# Patient Record
Sex: Male | Born: 1995
Health system: Southern US, Community
[De-identification: ages and names within clinical notes are randomized; demographics above are authoritative.]

## PROBLEM LIST (undated history)

## (undated) DIAGNOSIS — B081 Molluscum contagiosum: Secondary | ICD-10-CM

## (undated) HISTORY — PX: MOUTH SURGERY: SHX715

## (undated) HISTORY — PX: WISDOM TOOTH EXTRACTION: SHX21

## (undated) HISTORY — DX: Molluscum contagiosum: B08.1

---
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
## 2015-07-25 DIAGNOSIS — L700 Acne vulgaris: Secondary | ICD-10-CM | POA: Diagnosis not present

## 2015-07-25 DIAGNOSIS — Z79899 Other long term (current) drug therapy: Secondary | ICD-10-CM | POA: Diagnosis not present

## 2015-07-26 DIAGNOSIS — Z79899 Other long term (current) drug therapy: Secondary | ICD-10-CM | POA: Diagnosis not present

## 2015-07-26 DIAGNOSIS — L700 Acne vulgaris: Secondary | ICD-10-CM | POA: Diagnosis not present

## 2015-08-25 DIAGNOSIS — L700 Acne vulgaris: Secondary | ICD-10-CM | POA: Diagnosis not present

## 2015-08-25 DIAGNOSIS — Z79899 Other long term (current) drug therapy: Secondary | ICD-10-CM | POA: Diagnosis not present

## 2015-08-25 DIAGNOSIS — K130 Diseases of lips: Secondary | ICD-10-CM | POA: Diagnosis not present

## 2015-08-26 DIAGNOSIS — L700 Acne vulgaris: Secondary | ICD-10-CM | POA: Diagnosis not present

## 2015-08-26 DIAGNOSIS — Z79899 Other long term (current) drug therapy: Secondary | ICD-10-CM | POA: Diagnosis not present

## 2015-09-20 DIAGNOSIS — Z79899 Other long term (current) drug therapy: Secondary | ICD-10-CM | POA: Diagnosis not present

## 2015-09-20 DIAGNOSIS — L700 Acne vulgaris: Secondary | ICD-10-CM | POA: Diagnosis not present

## 2015-09-27 DIAGNOSIS — Z79899 Other long term (current) drug therapy: Secondary | ICD-10-CM | POA: Diagnosis not present

## 2015-09-27 DIAGNOSIS — L700 Acne vulgaris: Secondary | ICD-10-CM | POA: Diagnosis not present

## 2015-10-10 DIAGNOSIS — R21 Rash and other nonspecific skin eruption: Secondary | ICD-10-CM | POA: Diagnosis not present

## 2015-10-10 DIAGNOSIS — J069 Acute upper respiratory infection, unspecified: Secondary | ICD-10-CM | POA: Diagnosis not present

## 2015-10-12 DIAGNOSIS — R21 Rash and other nonspecific skin eruption: Secondary | ICD-10-CM | POA: Diagnosis not present

## 2015-10-12 DIAGNOSIS — B081 Molluscum contagiosum: Secondary | ICD-10-CM | POA: Diagnosis not present

## 2015-10-12 DIAGNOSIS — L700 Acne vulgaris: Secondary | ICD-10-CM | POA: Diagnosis not present

## 2015-11-08 DIAGNOSIS — J019 Acute sinusitis, unspecified: Secondary | ICD-10-CM | POA: Diagnosis not present

## 2015-11-08 DIAGNOSIS — Z113 Encounter for screening for infections with a predominantly sexual mode of transmission: Secondary | ICD-10-CM | POA: Diagnosis not present

## 2015-11-08 DIAGNOSIS — B081 Molluscum contagiosum: Secondary | ICD-10-CM | POA: Diagnosis not present

## 2015-11-21 DIAGNOSIS — L700 Acne vulgaris: Secondary | ICD-10-CM | POA: Diagnosis not present

## 2015-11-21 DIAGNOSIS — R21 Rash and other nonspecific skin eruption: Secondary | ICD-10-CM | POA: Diagnosis not present

## 2015-11-21 DIAGNOSIS — B081 Molluscum contagiosum: Secondary | ICD-10-CM | POA: Diagnosis not present

## 2015-11-21 DIAGNOSIS — L905 Scar conditions and fibrosis of skin: Secondary | ICD-10-CM | POA: Diagnosis not present

## 2015-12-06 ENCOUNTER — Encounter: Payer: Self-pay | Admitting: Internal Medicine

## 2015-12-06 ENCOUNTER — Ambulatory Visit (INDEPENDENT_AMBULATORY_CARE_PROVIDER_SITE_OTHER): Payer: Self-pay | Admitting: Internal Medicine

## 2015-12-06 DIAGNOSIS — B081 Molluscum contagiosum: Secondary | ICD-10-CM

## 2015-12-06 DIAGNOSIS — Z0000 Encounter for general adult medical examination without abnormal findings: Secondary | ICD-10-CM

## 2015-12-06 DIAGNOSIS — L709 Acne, unspecified: Secondary | ICD-10-CM

## 2015-12-06 NOTE — Progress Notes (Signed)
Pre visit review using our clinic review tool, if applicable. No additional management support is needed unless otherwise documented below in the visit note. 

## 2015-12-06 NOTE — Progress Notes (Signed)
Patient ID: Melvin Murphy, male   DOB: Jul 30, 1995, 20 y.o.   MRN: [id number]   Subjective:    Patient ID: Melvin Murphy, male    DOB: 09-25-1995, 20 y.o.   MRN: [id number]  HPI  Patient here to establish care.  He is a Consulting civil engineer at [employer].  Doing well.  Exercises regularly.  No chest pain.  No history of asthma or breathing issues.  No acid reflux.  No abdominal pain or cramping.  Bowels stable.  Was on accutane.  Stopped 10/01/15.  Developed a rash.  Diagnosed with molluscum contagiosum.  Has seen dermatology.  Treating.  Is better.  Some stress issues related to this.  Discussed with him today.  He does feel he is handling this relatively well.  Not sexually active currently.  Has been in the past.     Past Medical History:  Diagnosis Date  . Molluscum contagiosum    Past Surgical History:  Procedure Laterality Date  . MOUTH SURGERY    . WISDOM TOOTH EXTRACTION     Family History  Problem Relation Age of Onset  . Cancer Father     skin   Social History   Social History  . Marital status: Single    Spouse name: N/A  . Number of children: N/A  . Years of education: N/A   Social History Main Topics  . Smoking status: Never Smoker  . Smokeless tobacco: Never Used  . Alcohol use None  . Drug use: Unknown  . Sexual activity: Not Asked   Other Topics Concern  . None   Social History Narrative  . None    Outpatient Encounter Prescriptions as of 12/06/2015  Medication Sig  . [DISCONTINUED] doxycycline (VIBRA-TABS) 100 MG tablet    No facility-administered encounter medications on file as of 12/06/2015.     Review of Systems  Constitutional: Negative for appetite change and unexpected weight change.  HENT: Negative for congestion and sinus pressure.   Respiratory: Negative for cough, chest tightness and shortness of breath.   Cardiovascular: Negative for chest pain, palpitations and leg swelling.  Gastrointestinal: Negative for abdominal pain, diarrhea, nausea and vomiting.    Genitourinary: Negative for difficulty urinating and dysuria.  Musculoskeletal: Negative for back pain and joint swelling.  Skin: Positive for rash. Negative for color change.  Neurological: Negative for dizziness, light-headedness and headaches.  Psychiatric/Behavioral: Negative for agitation and dysphoric mood.       Some increased stress related to the diagnosis of molluscum.         Objective:    Physical Exam  Constitutional: He appears well-developed and well-nourished. No distress.  HENT:  Nose: Nose normal.  Mouth/Throat: Oropharynx is clear and moist.  Neck: Neck supple. No thyromegaly present.  Cardiovascular: Normal rate and regular rhythm.   Pulmonary/Chest: Effort normal and breath sounds normal. No respiratory distress.  Abdominal: Soft. Bowel sounds are normal. There is no tenderness.  Musculoskeletal: He exhibits no edema or tenderness.  Lymphadenopathy:    He has no cervical adenopathy.  Skin:  Rash noted - suprapubic area and groin.    Psychiatric: He has a normal mood and affect. His behavior is normal.    BP 120/80   Pulse 84   Temp 98.1 F (36.7 C) (Oral)   Ht 6\' 1"  (1.854 m)   Wt 173 lb 6.4 oz (78.7 kg)   SpO2 97%   BMI 22.88 kg/m  Wt Readings from Last 3 Encounters:  12/06/15 173 lb 6.4 oz (  78.7 kg)  01/18/14 175 lb (79.4 kg) (82 %, Z= 0.92)*   * Growth percentiles are based on CDC 2-20 Years data.        Assessment & Plan:   Problem List Items Addressed This Visit    Acne    Has seen dermatology.  Previously treated with accutane. On no medication now.       Healthcare maintenance    States is up to date with immunizations.  Obtain records.  Schedule him for a physical.        Molluscum contagiosum    Has seen dermatology.  Treating.  Improved.  Follow.         Other Visit Diagnoses   None.      Dale DurhamSCOTT, Amias Hutchinson, MD

## 2015-12-19 ENCOUNTER — Encounter: Payer: Self-pay | Admitting: Internal Medicine

## 2015-12-19 DIAGNOSIS — Z0000 Encounter for general adult medical examination without abnormal findings: Secondary | ICD-10-CM | POA: Insufficient documentation

## 2015-12-19 DIAGNOSIS — L709 Acne, unspecified: Secondary | ICD-10-CM | POA: Insufficient documentation

## 2015-12-19 DIAGNOSIS — B081 Molluscum contagiosum: Secondary | ICD-10-CM | POA: Insufficient documentation

## 2015-12-19 NOTE — Assessment & Plan Note (Signed)
States is up to date with immunizations.  Obtain records.  Schedule him for a physical.

## 2015-12-19 NOTE — Assessment & Plan Note (Signed)
Has seen dermatology.  Previously treated with accutane. On no medication now.

## 2015-12-19 NOTE — Assessment & Plan Note (Signed)
Has seen dermatology.  Treating.  Improved.  Follow.

## 2015-12-26 DIAGNOSIS — L700 Acne vulgaris: Secondary | ICD-10-CM | POA: Diagnosis not present

## 2015-12-26 DIAGNOSIS — B081 Molluscum contagiosum: Secondary | ICD-10-CM | POA: Diagnosis not present

## 2015-12-26 DIAGNOSIS — R208 Other disturbances of skin sensation: Secondary | ICD-10-CM | POA: Diagnosis not present

## 2016-01-12 DIAGNOSIS — Z202 Contact with and (suspected) exposure to infections with a predominantly sexual mode of transmission: Secondary | ICD-10-CM | POA: Diagnosis not present

## 2016-02-22 DIAGNOSIS — N489 Disorder of penis, unspecified: Secondary | ICD-10-CM | POA: Diagnosis not present

## 2016-03-06 DIAGNOSIS — I788 Other diseases of capillaries: Secondary | ICD-10-CM | POA: Diagnosis not present

## 2016-03-06 DIAGNOSIS — L738 Other specified follicular disorders: Secondary | ICD-10-CM | POA: Diagnosis not present

## 2016-03-06 DIAGNOSIS — L819 Disorder of pigmentation, unspecified: Secondary | ICD-10-CM | POA: Diagnosis not present

## 2016-03-06 DIAGNOSIS — L720 Epidermal cyst: Secondary | ICD-10-CM | POA: Diagnosis not present

## 2016-03-08 DIAGNOSIS — N481 Balanitis: Secondary | ICD-10-CM | POA: Diagnosis not present

## 2016-03-08 DIAGNOSIS — N4889 Other specified disorders of penis: Secondary | ICD-10-CM | POA: Diagnosis not present

## 2016-03-26 DIAGNOSIS — L738 Other specified follicular disorders: Secondary | ICD-10-CM | POA: Diagnosis not present

## 2016-03-29 DIAGNOSIS — J029 Acute pharyngitis, unspecified: Secondary | ICD-10-CM | POA: Diagnosis not present

## 2016-04-02 DIAGNOSIS — L720 Epidermal cyst: Secondary | ICD-10-CM | POA: Diagnosis not present

## 2016-04-02 DIAGNOSIS — L738 Other specified follicular disorders: Secondary | ICD-10-CM | POA: Diagnosis not present

## 2016-04-23 DIAGNOSIS — L738 Other specified follicular disorders: Secondary | ICD-10-CM | POA: Diagnosis not present

## 2016-04-23 DIAGNOSIS — L309 Dermatitis, unspecified: Secondary | ICD-10-CM | POA: Diagnosis not present

## 2016-04-23 DIAGNOSIS — L720 Epidermal cyst: Secondary | ICD-10-CM | POA: Diagnosis not present

## 2016-05-16 DIAGNOSIS — L739 Follicular disorder, unspecified: Secondary | ICD-10-CM | POA: Diagnosis not present

## 2016-05-16 DIAGNOSIS — Z711 Person with feared health complaint in whom no diagnosis is made: Secondary | ICD-10-CM | POA: Diagnosis not present

## 2016-06-06 DIAGNOSIS — L720 Epidermal cyst: Secondary | ICD-10-CM | POA: Diagnosis not present

## 2016-06-06 DIAGNOSIS — L0390 Cellulitis, unspecified: Secondary | ICD-10-CM | POA: Diagnosis not present

## 2016-06-07 ENCOUNTER — Encounter: Payer: Self-pay | Admitting: Internal Medicine

## 2016-06-07 ENCOUNTER — Ambulatory Visit (INDEPENDENT_AMBULATORY_CARE_PROVIDER_SITE_OTHER): Payer: BLUE CROSS/BLUE SHIELD | Admitting: Internal Medicine

## 2016-06-07 VITALS — BP 118/78 | HR 71 | Temp 97.6°F | Resp 12 | Ht 73.23 in | Wt 175.8 lb

## 2016-06-07 DIAGNOSIS — G479 Sleep disorder, unspecified: Secondary | ICD-10-CM

## 2016-06-07 DIAGNOSIS — N489 Disorder of penis, unspecified: Secondary | ICD-10-CM | POA: Diagnosis not present

## 2016-06-07 DIAGNOSIS — B081 Molluscum contagiosum: Secondary | ICD-10-CM | POA: Diagnosis not present

## 2016-06-07 DIAGNOSIS — Z0000 Encounter for general adult medical examination without abnormal findings: Secondary | ICD-10-CM

## 2016-06-07 NOTE — Progress Notes (Signed)
Pre-visit discussion using our clinic review tool. No additional management support is needed unless otherwise documented below in the visit note.  

## 2016-06-07 NOTE — Progress Notes (Signed)
Patient ID: Melvin Murphy, male   DOB: [birth year], 21 y.o.   MRN: [id number]   Subjective:    Patient ID: MACOY RODWELL, male    DOB: 08/20/1995, 21 y.o.   MRN: [id number]  HPI  Patient here for his physical exam.  He states he is doing relatively well.  Increased work hours and increased classes.  Also has commitments with organizations at school.  Due to time, decreased hours of sleep.  Discussed sleeping more on the weekends.  Is exercising.  School will be out in one month.  No chest pain.  Breathing stable.  No significant problems with allergies/sinus.  No acid reflux.  No abdominal pain.  Bowels moving.  Lesion on penis.  Just evaluated by dermatology last week.  Felt to be cyst.  Molluscum rash cleared.  Was checked for STDs at acute care.  HSV 1 present.  Discussed with him today.  Overall he feels he is doing well.     Past Medical History:  Diagnosis Date  . Molluscum contagiosum    Past Surgical History:  Procedure Laterality Date  . MOUTH SURGERY    . WISDOM TOOTH EXTRACTION     Family History  Problem Relation Age of Onset  . Cancer Father     skin   Social History   Social History  . Marital status: Single    Spouse name: N/A  . Number of children: N/A  . Years of education: N/A   Social History Main Topics  . Smoking status: Never Smoker  . Smokeless tobacco: Never Used  . Alcohol use None  . Drug use: Unknown  . Sexual activity: Not Asked   Other Topics Concern  . None   Social History Narrative  . None    Outpatient Encounter Prescriptions as of 06/07/2016  Medication Sig  . doxycycline (DORYX) 100 MG EC tablet Take 100 mg by mouth 2 (two) times daily.   No facility-administered encounter medications on file as of 06/07/2016.     Review of Systems  Constitutional: Negative for appetite change and unexpected weight change.  HENT: Negative for congestion and sinus pressure.   Eyes: Negative for pain and visual disturbance.  Respiratory: Negative for  cough, chest tightness and shortness of breath.   Cardiovascular: Negative for chest pain, palpitations and leg swelling.  Gastrointestinal: Negative for abdominal pain, diarrhea, nausea and vomiting.  Genitourinary: Negative for difficulty urinating and dysuria.  Musculoskeletal: Negative for back pain and joint swelling.  Skin: Negative for color change and rash.  Neurological: Negative for dizziness, light-headedness and headaches.  Hematological: Negative for adenopathy. Does not bruise/bleed easily.  Psychiatric/Behavioral: Negative for agitation and dysphoric mood.       Objective:    Physical Exam  Constitutional: He is oriented to person, place, and time. He appears well-developed and well-nourished. No distress.  HENT:  Head: Normocephalic and atraumatic.  Nose: Nose normal.  Mouth/Throat: Oropharynx is clear and moist. No oropharyngeal exudate.  Eyes: Conjunctivae are normal. Right eye exhibits no discharge. Left eye exhibits no discharge.  Neck: Neck supple. No thyromegaly present.  Cardiovascular: Normal rate and regular rhythm.   Pulmonary/Chest: Breath sounds normal. No respiratory distress. He has no wheezes.  Abdominal: Soft. Bowel sounds are normal. There is no tenderness.  Genitourinary:  Genitourinary Comments: Penis - lesion - base of penis.  No herpetic lesions noted.  No palpable testicular nodules.    Musculoskeletal: He exhibits no edema or tenderness.  Lymphadenopathy:  He has no cervical adenopathy.  Neurological: He is alert and oriented to person, place, and time.  Skin: Skin is warm and dry. No rash noted. No erythema.  Psychiatric: He has a normal mood and affect. His behavior is normal.    BP 118/78 (BP Location: Left Arm, Patient Position: Sitting, Cuff Size: Normal)   Pulse 71   Temp 97.6 F (36.4 C) (Oral)   Resp 12   Ht 6' 1.23" (1.86 m)   Wt 175 lb 12.8 oz (79.7 kg)   SpO2 98%   BMI 23.05 kg/m  Wt Readings from Last 3 Encounters:    06/07/16 175 lb 12.8 oz (79.7 kg)  12/06/15 173 lb 6.4 oz (78.7 kg)  01/18/14 175 lb (79.4 kg) (82 %, Z= 0.92)*   * Growth percentiles are based on CDC 2-20 Years data.       Assessment & Plan:   Problem List Items Addressed This Visit    Healthcare maintenance    Physical today 06/07/16.        Molluscum contagiosum    Rash resolved.  Follow.        Other Visit Diagnoses    Sleeping difficulties    -  Primary   Increased work and school.  Will be out of school in one month.  Try to adapt behavior and schedule.  Follow.    Penile lesion       Has improved.  Saw dermatology yesterday.  Continue bactroban.        Dale Kilbourne, MD

## 2016-06-10 ENCOUNTER — Encounter: Payer: Self-pay | Admitting: Internal Medicine

## 2016-06-10 NOTE — Assessment & Plan Note (Signed)
Physical today 06/07/16.

## 2016-06-10 NOTE — Assessment & Plan Note (Signed)
Rash resolved.  Follow.

## 2016-06-26 ENCOUNTER — Other Ambulatory Visit: Payer: Self-pay | Admitting: Family

## 2016-06-26 ENCOUNTER — Ambulatory Visit (INDEPENDENT_AMBULATORY_CARE_PROVIDER_SITE_OTHER): Payer: BLUE CROSS/BLUE SHIELD | Admitting: Family

## 2016-06-26 ENCOUNTER — Encounter: Payer: Self-pay | Admitting: Family

## 2016-06-26 VITALS — BP 120/78 | HR 78 | Temp 98.3°F | Ht 73.0 in | Wt 181.6 lb

## 2016-06-26 DIAGNOSIS — L728 Other follicular cysts of the skin and subcutaneous tissue: Secondary | ICD-10-CM | POA: Diagnosis not present

## 2016-06-26 DIAGNOSIS — N489 Disorder of penis, unspecified: Secondary | ICD-10-CM | POA: Diagnosis not present

## 2016-06-26 DIAGNOSIS — L738 Other specified follicular disorders: Secondary | ICD-10-CM | POA: Diagnosis not present

## 2016-06-26 NOTE — Addendum Note (Signed)
Addended by: Penne Lash on: 06/26/2016 02:46 PM   Modules accepted: Orders

## 2016-06-26 NOTE — Addendum Note (Signed)
Addended by: Penne Lash on: 06/26/2016 02:42 PM   Modules accepted: Orders

## 2016-06-26 NOTE — Progress Notes (Signed)
   Subjective:    Patient ID: Melvin Murphy, male    DOB: Oct 05, 1995, 20 y.o.   MRN: [id number]  CC: Melvin Murphy is a 21 y.o. male who presents today for an acute visit.    HPI: CC: noticed one bump on shaft of penis 2 months ago, 'healing.' 'it popped' and 'pus came out' . It is not painful, not itchy. Continues to try to pop it and feels like taking longer to heal.  Worried about herpes. Does also notes sensitive skin and wanders about soaps.  Over all feels well, no fever, N, V, D, arthralgia.   3 weeks ago tested for chlamydia, gonorrhea, syphillis. Swabbed lesion for wound culture and told mixed flora.   Unprotected sex with girlfriend.   h/o hsv -1 and cold sores in mouth.      HISTORY:  Past Medical History:  Diagnosis Date  . Molluscum contagiosum    Past Surgical History:  Procedure Laterality Date  . MOUTH SURGERY    . WISDOM TOOTH EXTRACTION     Family History  Problem Relation Age of Onset  . Cancer Father     skin    Allergies: Sulfa antibiotics and Amoxicillin Current Outpatient Prescriptions on File Prior to Visit  Medication Sig Dispense Refill  . doxycycline (DORYX) 100 MG EC tablet Take 100 mg by mouth 2 (two) times daily.     No current facility-administered medications on file prior to visit.     Social History  Substance Use Topics  . Smoking status: Never Smoker  . Smokeless tobacco: Never Used  . Alcohol use Not on file    Review of Systems  Constitutional: Negative for chills and fever.  Respiratory: Negative for cough.   Cardiovascular: Negative for chest pain and palpitations.  Gastrointestinal: Negative for nausea and vomiting.      Objective:    BP 120/78   Pulse 78   Temp 98.3 F (36.8 C) (Oral)   Ht  (1.854 m)   Wt 181 lb 9.6 oz (82.4 kg)   SpO2 98%   BMI 23.96 kg/m    Physical Exam  Constitutional: He appears well-developed and well-nourished.  Cardiovascular: Regular rhythm and normal heart sounds.     Pulmonary/Chest: Effort normal and breath sounds normal. No respiratory distress. He has no wheezes. He has no rhonchi. He has no rales.  Neurological: He is alert.  Skin: Skin is warm and dry. Lesion noted. No rash noted.  Dorsal aspect of penile shaft < 1cm scabbed over lesion noted. No vesicle. Non draining.   Psychiatric: He has a normal mood and affect. His speech is normal and behavior is normal.  Vitals reviewed.      Assessment & Plan:  1. Penile lesion Symptoms most consistent with HSV. 3/14/ neg chlamydia, gonorrhea. RPR non reactive. Declines HIV, Hepatitis testing.  Pending culture and lab work.    - Herpes simplex virus culture - HSV(herpes smplx)abs-1+2(IgG+IgM)-bld; Future     I am having Mr. Geisen maintain his doxycycline.   No orders of the defined types were placed in this encounter.   Return precautions given.   Risks, benefits, and alternatives of the medications and treatment plan prescribed today were discussed, and patient expressed understanding.   Education regarding symptom management and diagnosis given to patient on AVS.  Continue to follow with Dale Superior, MD for routine health maintenance.   Henrene Dodge and I agreed with plan.   Rennie Plowman, FNP

## 2016-06-26 NOTE — Progress Notes (Signed)
Pre visit review using our clinic review tool, if applicable. No additional management support is needed unless otherwise documented below in the visit note. 

## 2016-06-26 NOTE — Addendum Note (Signed)
Addended byElise Benne T on: 06/26/2016 02:46 PM   Modules accepted: Orders

## 2016-06-26 NOTE — Patient Instructions (Signed)
Will let you know test results  Let us know if worsens

## 2016-06-28 LAB — HERPES SIMPLEX VIRUS CULTURE: ORGANISM ID, BACTERIA: NOT DETECTED

## 2016-06-29 LAB — HSV 1/2 AB (IGM), IFA W/RFLX TITER
HSV 1 IGM SCREEN: NEGATIVE
HSV 2 IGM SCREEN: NEGATIVE

## 2016-07-21 ENCOUNTER — Encounter: Payer: Self-pay | Admitting: Internal Medicine

## 2016-08-03 ENCOUNTER — Encounter: Payer: Self-pay | Admitting: Internal Medicine

## 2016-09-17 DIAGNOSIS — N4889 Other specified disorders of penis: Secondary | ICD-10-CM | POA: Diagnosis not present

## 2016-12-05 ENCOUNTER — Ambulatory Visit (INDEPENDENT_AMBULATORY_CARE_PROVIDER_SITE_OTHER): Payer: BLUE CROSS/BLUE SHIELD | Admitting: Internal Medicine

## 2016-12-05 ENCOUNTER — Encounter: Payer: Self-pay | Admitting: Internal Medicine

## 2016-12-05 DIAGNOSIS — Z711 Person with feared health complaint in whom no diagnosis is made: Secondary | ICD-10-CM

## 2016-12-05 NOTE — Progress Notes (Signed)
Patient ID: Melvin Murphy, male   DOB: 10/04/1995, 21 y.o.   MRN: [id number]   Subjective:    Patient ID: Melvin Murphy, male    DOB: Jun 07, 1995, 21 y.o.   MRN: [id number]  HPI  Patient here as a work in with concerns regarding a previous penile lesion.  He has been tested previously.  HSV II negative on previous check.  He had questions about HSV and testing.  No lesions now.  Working and going to school.  Sleeping more.  No increased fatigue.  Overall feels he is doing relatively well, just wants to clarify if he has been exposed previously to HSV II.     Past Medical History:  Diagnosis Date  . Molluscum contagiosum    Past Surgical History:  Procedure Laterality Date  . MOUTH SURGERY    . WISDOM TOOTH EXTRACTION     Family History  Problem Relation Age of Onset  . Cancer Father        skin   Social History   Social History  . Marital status: Single    Spouse name: N/A  . Number of children: N/A  . Years of education: N/A   Social History Main Topics  . Smoking status: Never Smoker  . Smokeless tobacco: Never Used  . Alcohol use None  . Drug use: Unknown  . Sexual activity: Not Asked   Other Topics Concern  . None   Social History Narrative  . None    Outpatient Encounter Prescriptions as of 12/05/2016  Medication Sig  . doxycycline (DORYX) 100 MG EC tablet Take 100 mg by mouth 2 (two) times daily.   No facility-administered encounter medications on file as of 12/05/2016.     Review of Systems  Constitutional: Negative for appetite change and unexpected weight change.  HENT: Negative for congestion and sinus pressure.   Respiratory: Negative for cough.   Gastrointestinal: Negative for nausea and vomiting.  Genitourinary: Negative for dysuria.       No penile lesions.   Skin: Negative for color change and rash.       Objective:    Physical Exam  Constitutional: He appears well-developed and well-nourished. No distress.  HENT:  Nose: Nose normal.    Mouth/Throat: Oropharynx is clear and moist.  Neck: Neck supple.  Cardiovascular: Normal rate and regular rhythm.   Pulmonary/Chest: Effort normal and breath sounds normal. No respiratory distress.  Abdominal: Soft. Bowel sounds are normal. There is no tenderness.  Musculoskeletal: He exhibits no edema.  Lymphadenopathy:    He has no cervical adenopathy.  Skin: No rash noted. No erythema.  Psychiatric: He has a normal mood and affect. His behavior is normal.    BP 118/78 (BP Location: Left Arm, Patient Position: Sitting, Cuff Size: Normal)   Pulse 64   Temp 98.3 F (36.8 C) (Oral)   Resp 20   Wt 179 lb 8 oz (81.4 kg)   SpO2 98%   BMI 23.68 kg/m  Wt Readings from Last 3 Encounters:  12/05/16 179 lb 8 oz (81.4 kg)  06/26/16 181 lb 9.6 oz (82.4 kg)  06/07/16 175 lb 12.8 oz (79.7 kg)       Assessment & Plan:   Problem List Items Addressed This Visit    Concern about STD in male without diagnosis    Discussed at length with him and his mother today.  He prefers to have Western Blot testing that is performed at [facility].  Called lab.  They  will forward kit.  Hold on testing here at pts request.            Einar Pheasant, MD

## 2016-12-07 ENCOUNTER — Ambulatory Visit: Payer: BLUE CROSS/BLUE SHIELD | Admitting: Internal Medicine

## 2016-12-08 ENCOUNTER — Encounter: Payer: Self-pay | Admitting: Internal Medicine

## 2016-12-08 DIAGNOSIS — Z711 Person with feared health complaint in whom no diagnosis is made: Secondary | ICD-10-CM | POA: Insufficient documentation

## 2016-12-08 NOTE — Assessment & Plan Note (Signed)
Discussed at length with him and his mother today.  He prefers to have Western Blot testing that is performed at [facility].  Called lab.  They will forward kit.  Hold on testing here at pts request.

## 2016-12-10 NOTE — Telephone Encounter (Signed)
This is the pt you contacted [facility] about - regarding the test for HSV I and II.  He received the kit.  Will you please notify him of time to come to lab and what he needs to do.  Let me know if any problems.    Dr Nicki Reaper

## 2016-12-13 ENCOUNTER — Telehealth: Payer: Self-pay | Admitting: Radiology

## 2016-12-13 ENCOUNTER — Other Ambulatory Visit: Payer: BLUE CROSS/BLUE SHIELD

## 2016-12-13 NOTE — Telephone Encounter (Signed)
Pt came in for a lab outside lab draw for HSV 1&2 Western Blot for the [place]. PCP is aware of lab draw. Drew pt with tube given by kit and spun blood down and poured serum into transport tube provided by kit. Specimen was kept frozen. Virology request form was filled out by PCP as stated to do so by kit. Kit and

## 2017-01-10 ENCOUNTER — Encounter: Payer: Self-pay | Admitting: Internal Medicine

## 2017-01-17 NOTE — Telephone Encounter (Signed)
I called for results.  They faxed information confirmation form.  Placed back in box to be faxed.  Waiting for results.  Please hold until receive lab results.

## 2017-01-18 ENCOUNTER — Telehealth: Payer: Self-pay

## 2017-01-18 NOTE — Telephone Encounter (Signed)
Called patient l/m to let him know that Dr. Lorin PicketScott received notes today and we call back after reviewed.

## 2017-01-18 NOTE — Telephone Encounter (Signed)
Copied from CRM [id number]. Topic: Quick Communication - Lab Results >> Jan 18, 2017  9:50 AM Waymon AmatoBurton, Donna F wrote: MB CRM LAB RESULTS:[id number]:::0 pt is checking on status of his lab results from dr scott  Best number [phone]

## 2017-01-21 NOTE — Telephone Encounter (Signed)
Called patient all information given per Dr. Lorin PicketScott HSV negative. Will call if any questions results sent to scan.

## 2017-05-24 DIAGNOSIS — R21 Rash and other nonspecific skin eruption: Secondary | ICD-10-CM | POA: Diagnosis not present

## 2017-11-18 ENCOUNTER — Telehealth: Payer: Self-pay

## 2017-11-18 NOTE — Telephone Encounter (Signed)
Copied from CRM [id number]. Topic: Appointment Scheduling - Scheduling Inquiry for Clinic >> Nov 18, 2017  1:38 PM Arlyss Gandyichardson, Taren N, NT wrote: Reason for CRM: Pts mom really wants pt to see Dr. Lorin PicketScott for back pain that he has had since August. She would like to see if he can be worked in on Dr. Marina GoodellScotts schedule.

## 2017-11-19 NOTE — Telephone Encounter (Signed)
Called and scheduled appt for 9/20

## 2017-11-22 ENCOUNTER — Ambulatory Visit: Payer: BLUE CROSS/BLUE SHIELD | Admitting: Internal Medicine

## 2018-02-10 DIAGNOSIS — R21 Rash and other nonspecific skin eruption: Secondary | ICD-10-CM | POA: Diagnosis not present

## 2018-11-08 DIAGNOSIS — R110 Nausea: Secondary | ICD-10-CM | POA: Diagnosis not present

## 2018-11-08 DIAGNOSIS — Z20828 Contact with and (suspected) exposure to other viral communicable diseases: Secondary | ICD-10-CM | POA: Diagnosis not present

## 2018-11-08 DIAGNOSIS — R509 Fever, unspecified: Secondary | ICD-10-CM | POA: Diagnosis not present

## 2018-11-24 DIAGNOSIS — L649 Androgenic alopecia, unspecified: Secondary | ICD-10-CM | POA: Diagnosis not present

## 2019-02-24 ENCOUNTER — Other Ambulatory Visit: Payer: BLUE CROSS/BLUE SHIELD

## 2019-02-25 ENCOUNTER — Ambulatory Visit: Payer: BC Managed Care – PPO | Attending: Internal Medicine

## 2019-02-25 DIAGNOSIS — Z20822 Contact with and (suspected) exposure to covid-19: Secondary | ICD-10-CM

## 2019-02-25 DIAGNOSIS — Z20828 Contact with and (suspected) exposure to other viral communicable diseases: Secondary | ICD-10-CM | POA: Diagnosis not present

## 2019-02-27 LAB — NOVEL CORONAVIRUS, NAA: SARS-CoV-2, NAA: NOT DETECTED

## 2019-03-10 ENCOUNTER — Ambulatory Visit: Payer: BC Managed Care – PPO | Attending: Internal Medicine

## 2019-03-10 DIAGNOSIS — Z20822 Contact with and (suspected) exposure to covid-19: Secondary | ICD-10-CM

## 2019-03-11 LAB — NOVEL CORONAVIRUS, NAA: SARS-CoV-2, NAA: NOT DETECTED

## 2019-06-24 ENCOUNTER — Ambulatory Visit: Payer: Self-pay | Admitting: Podiatry

## 2019-07-08 ENCOUNTER — Ambulatory Visit: Payer: BC Managed Care – PPO | Admitting: Podiatry

## 2019-07-08 ENCOUNTER — Other Ambulatory Visit: Payer: Self-pay

## 2019-07-08 ENCOUNTER — Ambulatory Visit (INDEPENDENT_AMBULATORY_CARE_PROVIDER_SITE_OTHER): Payer: BC Managed Care – PPO

## 2019-07-08 ENCOUNTER — Encounter: Payer: Self-pay | Admitting: Podiatry

## 2019-07-08 ENCOUNTER — Other Ambulatory Visit: Payer: Self-pay | Admitting: Podiatry

## 2019-07-08 DIAGNOSIS — M778 Other enthesopathies, not elsewhere classified: Secondary | ICD-10-CM

## 2019-07-08 DIAGNOSIS — M2142 Flat foot [pes planus] (acquired), left foot: Secondary | ICD-10-CM

## 2019-07-08 DIAGNOSIS — M722 Plantar fascial fibromatosis: Secondary | ICD-10-CM | POA: Diagnosis not present

## 2019-07-08 DIAGNOSIS — M2141 Flat foot [pes planus] (acquired), right foot: Secondary | ICD-10-CM | POA: Diagnosis not present

## 2019-07-08 NOTE — Progress Notes (Signed)
  Subjective:  Patient ID: Melvin Murphy, male    DOB: 12-28-1995,  MRN: [id number] HPI Chief Complaint  Patient presents with  . Foot Pain    Medial/arch bilateral (R>L) - sharp, pulling sensations x several months, active runner and this makes feel worse  . New Patient (Initial Visit)    Est pt 46    24 y.o. male presents with the above complaint.   ROS: Denies fever chills nausea vomiting muscle aches pains calf pain back pain chest pain shortness of breath  Past Medical History:  Diagnosis Date  . Molluscum contagiosum    Past Surgical History:  Procedure Laterality Date  . MOUTH SURGERY    . WISDOM TOOTH EXTRACTION     No current outpatient medications on file.  Allergies  Allergen Reactions  . Latex Itching  . Sulfa Antibiotics   . Amoxicillin Rash   Review of Systems Objective:  There were no vitals filed for this visit.  General: Well developed, nourished, in no acute distress, alert and oriented x3   Dermatological: Skin is warm, dry and supple bilateral. Nails x 10 are well maintained; remaining integument appears unremarkable at this time. There are no open sores, no preulcerative lesions, no rash or signs of infection present.  Vascular: Dorsalis Pedis artery and Posterior Tibial artery pedal pulses are 2/4 bilateral with immedate capillary fill time. Pedal hair growth present. No varicosities and no lower extremity edema present bilateral.   Neruologic: Grossly intact via light touch bilateral. Vibratory intact via tuning fork bilateral. Protective threshold with Semmes Wienstein monofilament intact to all pedal sites bilateral. Patellar and Achilles deep tendon reflexes 2+ bilateral. No Babinski or clonus noted bilateral.   Musculoskeletal: No gross boney pedal deformities bilateral. No pain, crepitus, or limitation noted with foot and ankle range of motion bilateral. Muscular strength 5/5 in all groups tested bilateral.  Gait: Unassisted, Nonantalgic.     Radiographs:  Radiographs taken today do not demonstrate any type of osseous abnormalities.  Structurally mature individual with mild pes planus.  No soft tissue inflammatory areas.  Assessment & Plan:   Assessment: More than likely plantar fascial pain  Plan: He saw it today for orthotics.     Urie Loughner T. [place], [place]

## 2019-08-12 ENCOUNTER — Ambulatory Visit: Payer: BC Managed Care – PPO | Admitting: Orthotics

## 2019-08-12 ENCOUNTER — Other Ambulatory Visit: Payer: Self-pay

## 2019-08-12 DIAGNOSIS — M722 Plantar fascial fibromatosis: Secondary | ICD-10-CM

## 2019-08-12 NOTE — Progress Notes (Signed)
Patient came in today to pick up custom made foot orthotics.  The goals were accomplished and the patient reported no dissatisfaction with said orthotics.  Patient was advised of breakin period and how to report any issues. 

## 2019-08-19 ENCOUNTER — Encounter: Payer: BC Managed Care – PPO | Admitting: Orthotics

## 2023-04-01 ENCOUNTER — Ambulatory Visit (INDEPENDENT_AMBULATORY_CARE_PROVIDER_SITE_OTHER): Payer: Commercial Managed Care - PPO | Admitting: Dermatology

## 2023-04-01 ENCOUNTER — Encounter: Payer: Self-pay | Admitting: Dermatology

## 2023-04-01 DIAGNOSIS — B360 Pityriasis versicolor: Secondary | ICD-10-CM | POA: Diagnosis not present

## 2023-04-01 DIAGNOSIS — W908XXA Exposure to other nonionizing radiation, initial encounter: Secondary | ICD-10-CM

## 2023-04-01 DIAGNOSIS — D1801 Hemangioma of skin and subcutaneous tissue: Secondary | ICD-10-CM

## 2023-04-01 DIAGNOSIS — D229 Melanocytic nevi, unspecified: Secondary | ICD-10-CM

## 2023-04-01 DIAGNOSIS — L649 Androgenic alopecia, unspecified: Secondary | ICD-10-CM

## 2023-04-01 DIAGNOSIS — Z1283 Encounter for screening for malignant neoplasm of skin: Secondary | ICD-10-CM

## 2023-04-01 DIAGNOSIS — L814 Other melanin hyperpigmentation: Secondary | ICD-10-CM

## 2023-04-01 DIAGNOSIS — L700 Acne vulgaris: Secondary | ICD-10-CM

## 2023-04-01 DIAGNOSIS — L709 Acne, unspecified: Secondary | ICD-10-CM

## 2023-04-01 DIAGNOSIS — L659 Nonscarring hair loss, unspecified: Secondary | ICD-10-CM

## 2023-04-01 DIAGNOSIS — L578 Other skin changes due to chronic exposure to nonionizing radiation: Secondary | ICD-10-CM

## 2023-04-01 MED ORDER — FINASTERIDE 1 MG PO TABS: 0.5000 mg | ORAL_TABLET | Freq: Every day | ORAL | 2 refills | Status: DC

## 2023-04-01 MED ORDER — KETOCONAZOLE 2 % EX SHAM: MEDICATED_SHAMPOO | CUTANEOUS | 11 refills | Status: AC

## 2023-04-01 NOTE — Patient Instructions (Addendum)

## 2023-04-01 NOTE — Progress Notes (Signed)
New Patient Visit   Subjective  Melvin Murphy is a 28 y.o. male who presents for the following: Skin Cancer Screening and Full Body Skin Exam. Pt has not noticed any spots of concern. Pt is concerned for hair loss and hair thinning, pt states he has dealt with this for a while, has not been seen since 2020. Pt has used topical Rogaine for hair loss twice a day started beginning of January 2024.  Pt does report family hx of early hair loss. Family has used minoxidil (Rogaine) for hair loss.   The patient presents for Total-Body Skin Exam (TBSE) for skin cancer screening and mole check. The patient has spots, moles and lesions to be evaluated, some may be new or changing and the patient may have concern these could be cancer.  The following portions of the chart were reviewed this encounter and updated as appropriate: medications, allergies, medical history  Review of Systems:  No other skin or systemic complaints except as noted in HPI or Assessment and Plan.  Objective  Well appearing patient in no apparent distress; mood and affect are within normal limits.  A full examination was performed including scalp, head, eyes, ears, nose, lips, neck, chest, axillae, abdomen, back, buttocks, bilateral upper extremities, bilateral lower extremities, hands, feet, fingers, toes, fingernails, and toenails. All findings within normal limits unless otherwise noted below.   Relevant physical exam findings are noted in the Assessment and Plan.                   Assessment & Plan   SKIN CANCER SCREENING PERFORMED TODAY.  ACTINIC DAMAGE - Chronic condition, secondary to cumulative UV/sun exposure - diffuse scaly erythematous macules with underlying dyspigmentation - Recommend daily broad spectrum sunscreen SPF 30+ to sun-exposed areas, reapply every 2 hours as needed.  - Staying in the shade or wearing long sleeves, sun glasses (UVA+UVB protection) and wide brim hats (4-inch brim  around the entire circumference of the hat) are also recommended for sun protection.  - Call for new or changing lesions.  LENTIGINES, HEMANGIOMAS - Benign normal skin lesions - Benign-appearing - Call for any changes  MELANOCYTIC NEVI - Tan-brown and/or pink-flesh-colored symmetric macules and papules - Benign appearing on exam today - Observation - Call clinic for new or changing moles - Recommend daily use of broad spectrum spf 30+ sunscreen to sun-exposed areas.    ANDROGENETIC ALOPECIA (MALE PATTERN HAIR LOSS), family history with father and younger brother affected Exam: Frontal scalp recession and reduced hair density on occipital scalp  Androgenetic Alopecia (or Male pattern hair loss) refers to the common patterned hair loss affecting many men.  Male pattern alopecia is mediated by dihydrotestosterone which induces miniaturization of androgen-sensitive hair follicles.  It is chronic and persistent, but treatable; not curable. Topical treatment includes: - 5% topical Minoxidil Oral treatment includes: - Finasteride 1 mg qd - Minoxidil 1.25 - 5 mg qd - Dutasteride 0.5 mg qd Adjunct therapy includes: - Low Level Laser Light Therapy (LLLT) - Platelet-rich Plasma injections (PRP) - Hair Transplantation or scalp reduction   Treatment Plan: Start finasteride 1/2 mg tablet a day. Need 6-12 months to see benefit - Discussed finasteride, discussed side effects including but not limited to sexual dysfunction, erectile dysfunction, decreased libido, depression, suicidality. Sexual side effects can be permanent  Continue topical minoxidil daily or BID to scalp  Long term medication management.  Patient is using long term (months to years) prescription medication  to control their  dermatologic condition.  These medications require periodic monitoring to evaluate for efficacy and side effects and may require periodic laboratory monitoring.    -Discussed oral minoxidil, patient  prefers to avoid given cardiac side effects Doses of minoxidil for hair loss are considered 'low dose'. This is because the doses used for hair loss are much lower than the doses which are used for conditions such as high blood pressure (hypertension). The doses used for hypertension are 10-40mg  per day.  Side effects are uncommon at the low doses (up to 2.5 mg/day) used to treat hair loss. Potential side effects, more commonly seen at higher doses, include: Increase in hair growth (hypertrichosis) elsewhere on face and body Temporary hair shedding upon starting medication which may last up to 4 weeks Ankle swelling, fluid retention, rapid weight gain more than 5 pounds Low blood pressure and feeling lightheaded or dizzy when standing up quickly Fast or irregular heartbeat Headaches    Tinea Versicolor, chronic flaring not at patient goal  Exam: tan thin scaly patches on upper back and shoulders  Tinea versicolor is a chronic recurrent skin rash causing discolored scaly spots most commonly seen on back, chest, and/or shoulders.  It is generally asymptomatic. The rash is due to overgrowth of a common type of yeast present on everyone's skin and it is not contagious.  It tends to flare more in the summer due to increased sweating on trunk.  After rash is treated, the scaliness will resolve, but the discoloration will take longer to return to normal pigmentation. The periodic use of an OTC medicated soap/shampoo with zinc or selenium sulfide can be helpful to prevent yeast overgrowth and recurrence.  Plan: Use ketoconazole 2% shampoo as body wash. Use twice a week, let sit for 5 mins before rinsing. May use on scalp for dandruff if needed.   Acne on back, s/p isotretinoin, chronic flaring not at goal, improved since isotretinoin  Exam: inflamed follicular papules on back  Plan: Recommend OTC Hibiclens daily in the shower     ALOPECIA   Related Medications finasteride (PROPECIA) 1 MG  tablet Take 0.5 tablets (0.5 mg total) by mouth daily. 1/2 tablet po every day TINEA VERSICOLOR   Related Medications ketoconazole (NIZORAL) 2 % shampoo Use as body wash, use twice a week, let sit for 5 mins before rinsing. May use on scalp for dandruff if needed. MULTIPLE BENIGN NEVI   CHERRY ANGIOMA   ACTINIC ELASTOSIS   LENTIGINES   ACNE VULGARIS    Return in about 10 months (around 01/30/2024) for w/ Dr. Katrinka Blazing.  Wynonia Lawman, CMA, am acting as scribe for Elie Goody, MD .   Documentation: I have reviewed the above documentation for accuracy and completeness, and I agree with the above.  Elie Goody, MD

## 2023-08-19 ENCOUNTER — Telehealth: Payer: Self-pay

## 2023-08-19 MED ORDER — FINASTERIDE 1 MG PO TABS: 1.0000 mg | ORAL_TABLET | Freq: Every day | ORAL | 1 refills | Status: DC

## 2023-08-19 NOTE — Telephone Encounter (Signed)
 Patient called needing additional Rfs of Finasteride  to the pharmacy. He has been taking 1 full tablet, 1 mg, daily. Okay to send that in with the new directions?

## 2023-09-25 ENCOUNTER — Other Ambulatory Visit: Payer: Self-pay | Admitting: Dermatology

## 2023-09-25 DIAGNOSIS — L659 Nonscarring hair loss, unspecified: Secondary | ICD-10-CM

## 2023-11-20 ENCOUNTER — Ambulatory Visit (INDEPENDENT_AMBULATORY_CARE_PROVIDER_SITE_OTHER)

## 2023-11-20 DIAGNOSIS — L814 Other melanin hyperpigmentation: Secondary | ICD-10-CM | POA: Diagnosis not present

## 2023-11-20 DIAGNOSIS — D1801 Hemangioma of skin and subcutaneous tissue: Secondary | ICD-10-CM | POA: Diagnosis not present

## 2023-11-20 DIAGNOSIS — L578 Other skin changes due to chronic exposure to nonionizing radiation: Secondary | ICD-10-CM

## 2023-11-20 DIAGNOSIS — Z808 Family history of malignant neoplasm of other organs or systems: Secondary | ICD-10-CM

## 2023-11-20 DIAGNOSIS — Z1283 Encounter for screening for malignant neoplasm of skin: Secondary | ICD-10-CM | POA: Diagnosis not present

## 2023-11-20 DIAGNOSIS — D229 Melanocytic nevi, unspecified: Secondary | ICD-10-CM

## 2023-11-20 DIAGNOSIS — W908XXA Exposure to other nonionizing radiation, initial encounter: Secondary | ICD-10-CM

## 2023-11-20 NOTE — Progress Notes (Signed)
    Subjective   Melvin Murphy is a 28 y.o. male who presents for the following: Lesion(s) of concern . Patient is established patient   Today patient reports: Spot at left upper arm, patient noticed a few weeks ago.   Review of Systems:    No other skin or systemic complaints except as noted in HPI or Assessment and Plan.  The following portions of the chart were reviewed this encounter and updated as appropriate: medications, allergies, medical history  Relevant Medical History:  Family history of skin cancer - patient's father   Objective  Well appearing patient in no apparent distress; mood and affect are within normal limits. Examination was performed of the: Waist Up Skin Exam: scalp, head, eyes, ears, nose, lips, neck, chest, axillae, upper extremities, abdomen, back, hands, fingers, fingernails   Examination notable for: Angioma(s): Scattered red vascular papule(s)  , Lentigo/lentigines: Scattered pigmented macules that are tan to brown in color and are somewhat non-uniform in shape and concentrated in the sun-exposed areas, Nevus/nevi: Scattered well-demarcated, regular, pigmented macule(s) and/or papule(s)    Examination limited by: Clothing   Nevus - left lateral forearm with a 3 mm brown pink papule    Assessment & Plan   SKIN CANCER SCREENING PERFORMED TODAY.  BENIGN SKIN FINDINGS  - Lentigines  - Hemangiomas   - Nevus/Multiple Benign Nevi - Reassurance provided regarding the benign appearance of lesions noted on exam today; no treatment is indicated in the absence of symptoms/changes. - Reinforced importance of photoprotective strategies including liberal and frequent sunscreen use of a broad-spectrum SPF 30 or greater, use of protective clothing, and sun avoidance for prevention of cutaneous malignancy and photoaging.  Counseled patient on the importance of regular self-skin monitoring as well as routine clinical skin examinations as scheduled.   ACTINIC DAMAGE -  Chronic condition, secondary to cumulative UV/sun exposure - Recommend daily broad spectrum sunscreen SPF 30+ to sun-exposed areas, reapply every 2 hours as needed.  - Staying in the shade or wearing long sleeves, sun glasses (UVA+UVB protection) and wide brim hats (4-inch brim around the entire circumference of the hat) are also recommended for sun protection.  - Call for new or changing lesions.  family history of melanoma - Reviewed medical history for full details  - Reviewed sun protective measures as above - Encouraged full body skin exams      Procedures, orders, diagnosis for this visit:  SKIN EXAM FOR MALIGNANT NEOPLASM   LENTIGO   CHERRY ANGIOMA   MULTIPLE BENIGN NEVI   ACTINIC SKIN DAMAGE    Skin exam for malignant neoplasm  Lentigo  Cherry angioma  Multiple benign nevi  Actinic skin damage    Return to clinic: Return in about 6 months (around 05/19/2024) for TBSE, with Dr. Raymund.  Melvin Murphy, RMA, am acting as scribe for Lauraine JAYSON Raymund, MD .   Documentation: I have reviewed the above documentation for accuracy and completeness, and I agree with the above.  Lauraine JAYSON Raymund, MD

## 2023-11-20 NOTE — Patient Instructions (Signed)

## 2024-02-17 ENCOUNTER — Ambulatory Visit: Payer: Commercial Managed Care - PPO | Admitting: Dermatology

## 2024-03-26 ENCOUNTER — Other Ambulatory Visit: Payer: Self-pay | Admitting: Dermatology

## 2024-03-26 DIAGNOSIS — L659 Nonscarring hair loss, unspecified: Secondary | ICD-10-CM

## 2024-04-02 ENCOUNTER — Telehealth: Payer: Self-pay

## 2024-04-02 NOTE — Telephone Encounter (Signed)
 Patient called states he was unable to get new rx of finasteride . Was taking wrong. Instead of 1/2 tab he was taking as 1 whole tab and ran out of medication. Would like to know if we could resubmit the prescription ? Pharmacy will not let him get another rx until it is approved by doctor.  Medication was last filled on 03/31/2024 given 45 pills and 1 refill.  Please advise

## 2024-04-06 ENCOUNTER — Encounter: Payer: Self-pay | Admitting: Dermatology

## 2024-05-04 ENCOUNTER — Ambulatory Visit
